# Patient Record
Sex: Female | Born: 1970 | Race: White | Hispanic: No | Marital: Married | Smoking: Never smoker
Health system: Southern US, Community
[De-identification: ages and names within clinical notes are randomized; demographics above are authoritative.]

## PROBLEM LIST (undated history)

## (undated) DIAGNOSIS — F909 Attention-deficit hyperactivity disorder, unspecified type: Secondary | ICD-10-CM

## (undated) DIAGNOSIS — F419 Anxiety disorder, unspecified: Secondary | ICD-10-CM

## (undated) DIAGNOSIS — F32A Depression, unspecified: Secondary | ICD-10-CM

## (undated) HISTORY — DX: Depression, unspecified: F32.A

## (undated) HISTORY — PX: ANTERIOR CRUCIATE LIGAMENT REPAIR: SHX115

## (undated) HISTORY — DX: Attention-deficit hyperactivity disorder, unspecified type: F90.9

## (undated) HISTORY — DX: Anxiety disorder, unspecified: F41.9

---

## 1998-03-08 ENCOUNTER — Other Ambulatory Visit: Admission: RE | Admit: 1998-03-08 | Discharge: 1998-03-08 | Payer: Self-pay | Admitting: Obstetrics & Gynecology

## 1999-02-21 ENCOUNTER — Other Ambulatory Visit: Admission: RE | Admit: 1999-02-21 | Discharge: 1999-02-21 | Payer: Self-pay | Admitting: Obstetrics & Gynecology

## 2000-03-28 ENCOUNTER — Other Ambulatory Visit: Admission: RE | Admit: 2000-03-28 | Discharge: 2000-03-28 | Payer: Self-pay | Admitting: Obstetrics & Gynecology

## 2001-04-25 ENCOUNTER — Other Ambulatory Visit: Admission: RE | Admit: 2001-04-25 | Discharge: 2001-04-25 | Payer: Self-pay | Admitting: Obstetrics & Gynecology

## 2002-06-10 ENCOUNTER — Other Ambulatory Visit: Admission: RE | Admit: 2002-06-10 | Discharge: 2002-06-10 | Payer: Self-pay | Admitting: Obstetrics & Gynecology

## 2003-06-16 ENCOUNTER — Other Ambulatory Visit: Admission: RE | Admit: 2003-06-16 | Discharge: 2003-06-16 | Payer: Self-pay | Admitting: Obstetrics & Gynecology

## 2004-03-10 ENCOUNTER — Ambulatory Visit (HOSPITAL_COMMUNITY): Admission: RE | Admit: 2004-03-10 | Discharge: 2004-03-10 | Payer: Self-pay | Admitting: Family Medicine

## 2004-08-23 ENCOUNTER — Other Ambulatory Visit: Admission: RE | Admit: 2004-08-23 | Discharge: 2004-08-23 | Payer: Self-pay | Admitting: Obstetrics & Gynecology

## 2005-01-16 ENCOUNTER — Other Ambulatory Visit: Admission: RE | Admit: 2005-01-16 | Discharge: 2005-01-16 | Payer: Self-pay | Admitting: Obstetrics & Gynecology

## 2005-08-23 ENCOUNTER — Other Ambulatory Visit: Admission: RE | Admit: 2005-08-23 | Discharge: 2005-08-23 | Payer: Self-pay | Admitting: Obstetrics & Gynecology

## 2006-01-17 IMAGING — CR DG LUMBAR SPINE COMPLETE 4+V
5 series · 5 of 5 positions shown · non-contrast
Comparison: none

CLINICAL DATA: Low back pain for the past week.  No known injury. 
COMPLETE LUMBAR SPINE ? 03/10/04
Five views demonstrate five non-rib bearing lumbar vertebrae and mild thoracolumbar scoliosis.  Mild anterior and lateral spur formation at the L2-3 and L3-4 levels.  L3 limbus vertebra with fragmentation.  Small calcific density at the lateral aspect of the L3-4 disc space on the left.  No pars defect or subluxations.  
IMPRESSION 
Mild scoliosis and mild degenerative changes, as described above.

[view not recorded (1 of 5)]
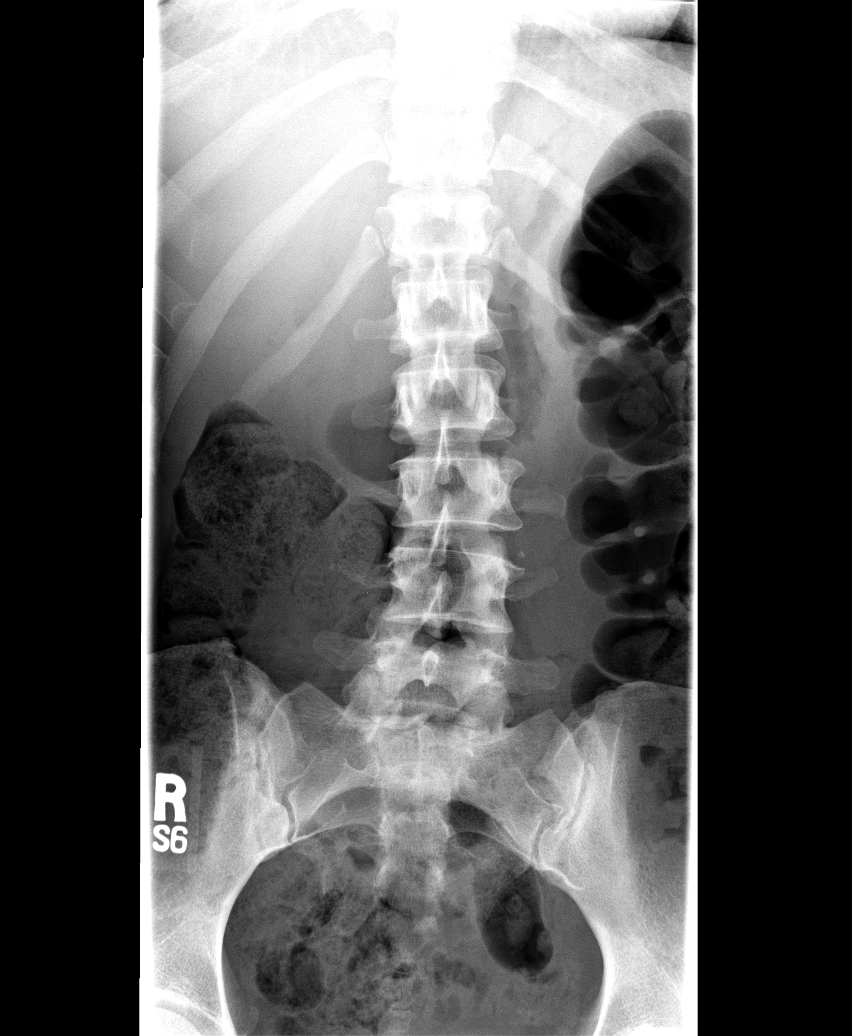

[view not recorded (2 of 5)]
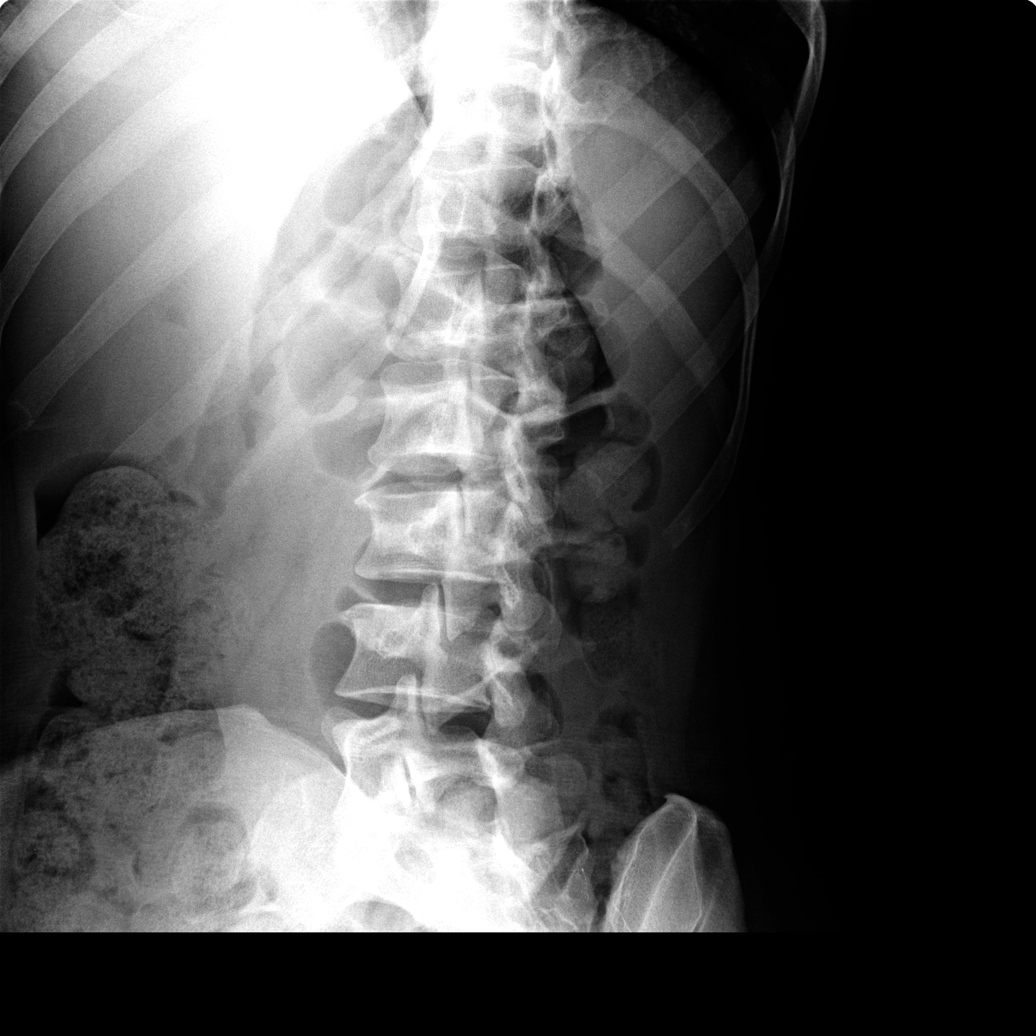

[view not recorded (3 of 5)]
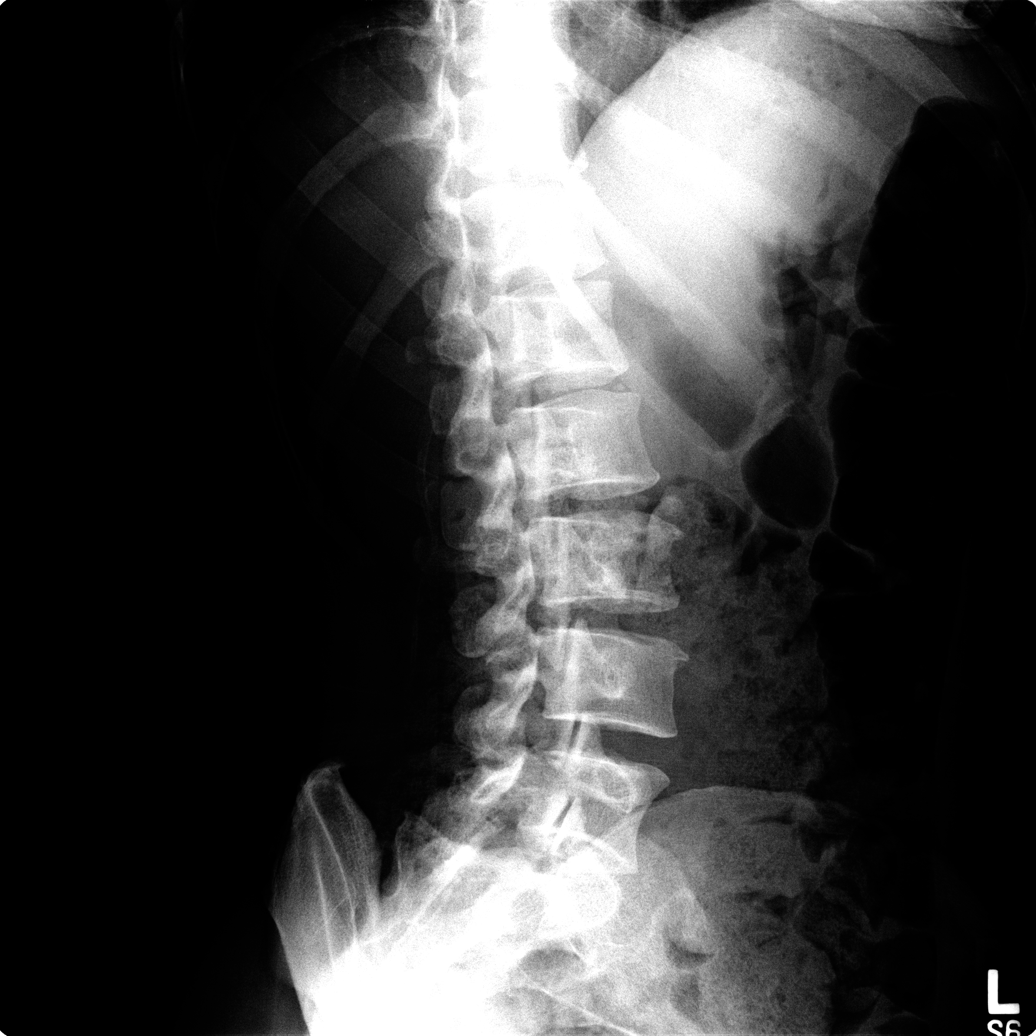

[view not recorded (4 of 5)]
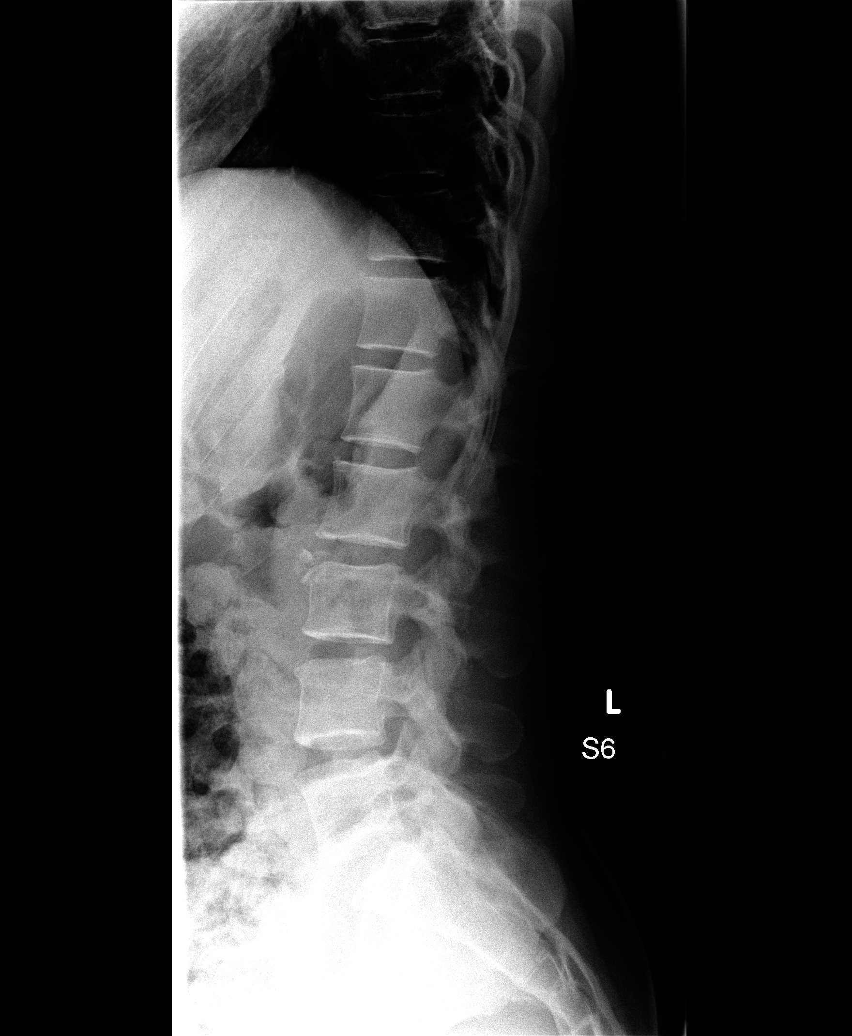

[view not recorded (5 of 5)]
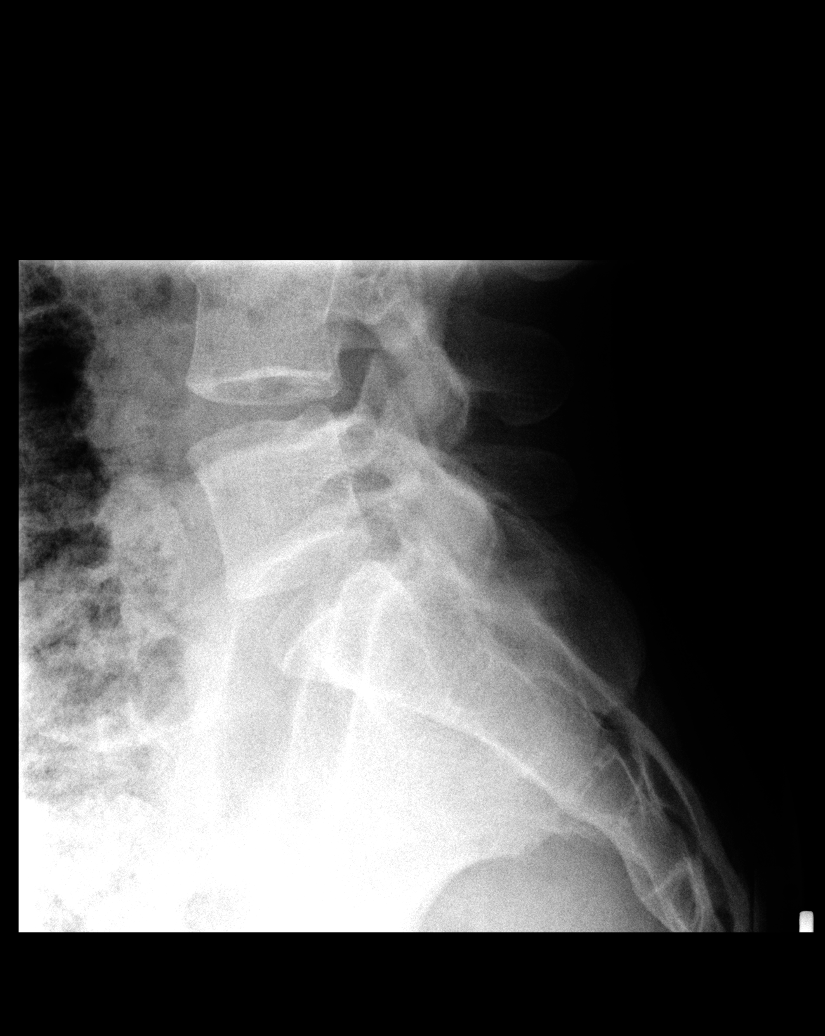

[5 of 5 positions shown; findings below may reference images not displayed]

## 2011-06-27 ENCOUNTER — Other Ambulatory Visit: Payer: Self-pay | Admitting: Obstetrics & Gynecology

## 2011-06-27 DIAGNOSIS — R928 Other abnormal and inconclusive findings on diagnostic imaging of breast: Secondary | ICD-10-CM

## 2011-07-11 ENCOUNTER — Ambulatory Visit
Admission: RE | Admit: 2011-07-11 | Discharge: 2011-07-11 | Disposition: A | Discharge status: Home / Self Care | Payer: BC Managed Care – PPO | Source: Ambulatory Visit | Attending: Obstetrics & Gynecology | Admitting: Obstetrics & Gynecology

## 2011-07-11 DIAGNOSIS — R928 Other abnormal and inconclusive findings on diagnostic imaging of breast: Secondary | ICD-10-CM

## 2014-02-19 ENCOUNTER — Other Ambulatory Visit: Payer: Self-pay | Admitting: Obstetrics & Gynecology

## 2014-02-19 DIAGNOSIS — R928 Other abnormal and inconclusive findings on diagnostic imaging of breast: Secondary | ICD-10-CM

## 2014-03-02 ENCOUNTER — Ambulatory Visit
Admission: RE | Admit: 2014-03-02 | Discharge: 2014-03-02 | Disposition: A | Discharge status: Home / Self Care | Payer: BC Managed Care – PPO | Source: Ambulatory Visit | Attending: Obstetrics & Gynecology | Admitting: Obstetrics & Gynecology

## 2014-03-02 DIAGNOSIS — R928 Other abnormal and inconclusive findings on diagnostic imaging of breast: Secondary | ICD-10-CM

## 2016-03-21 ENCOUNTER — Other Ambulatory Visit: Payer: Self-pay | Admitting: Obstetrics & Gynecology

## 2016-03-21 DIAGNOSIS — R921 Mammographic calcification found on diagnostic imaging of breast: Secondary | ICD-10-CM

## 2016-03-21 DIAGNOSIS — N63 Unspecified lump in unspecified breast: Secondary | ICD-10-CM

## 2016-03-27 ENCOUNTER — Ambulatory Visit
Admission: RE | Admit: 2016-03-27 | Discharge: 2016-03-27 | Disposition: A | Discharge status: Home / Self Care | Payer: BLUE CROSS/BLUE SHIELD | Source: Ambulatory Visit | Attending: Obstetrics & Gynecology | Admitting: Obstetrics & Gynecology

## 2016-03-27 DIAGNOSIS — R921 Mammographic calcification found on diagnostic imaging of breast: Secondary | ICD-10-CM

## 2016-04-04 DIAGNOSIS — E785 Hyperlipidemia, unspecified: Secondary | ICD-10-CM

## 2016-04-04 DIAGNOSIS — F4001 Agoraphobia with panic disorder: Secondary | ICD-10-CM | POA: Insufficient documentation

## 2016-04-04 HISTORY — DX: Hyperlipidemia, unspecified: E78.5

## 2017-04-02 ENCOUNTER — Other Ambulatory Visit: Payer: Self-pay | Admitting: Obstetrics & Gynecology

## 2017-04-02 DIAGNOSIS — N6001 Solitary cyst of right breast: Secondary | ICD-10-CM

## 2017-04-05 ENCOUNTER — Ambulatory Visit
Admission: RE | Admit: 2017-04-05 | Discharge: 2017-04-05 | Disposition: A | Discharge status: Home / Self Care | Payer: BLUE CROSS/BLUE SHIELD | Source: Ambulatory Visit | Attending: Obstetrics & Gynecology | Admitting: Obstetrics & Gynecology

## 2017-04-05 DIAGNOSIS — N6001 Solitary cyst of right breast: Secondary | ICD-10-CM

## 2017-04-09 DIAGNOSIS — E7800 Pure hypercholesterolemia, unspecified: Secondary | ICD-10-CM | POA: Insufficient documentation

## 2018-02-20 DIAGNOSIS — F419 Anxiety disorder, unspecified: Secondary | ICD-10-CM | POA: Insufficient documentation

## 2018-02-20 DIAGNOSIS — F988 Other specified behavioral and emotional disorders with onset usually occurring in childhood and adolescence: Secondary | ICD-10-CM | POA: Insufficient documentation

## 2018-02-20 DIAGNOSIS — G43009 Migraine without aura, not intractable, without status migrainosus: Secondary | ICD-10-CM | POA: Insufficient documentation

## 2022-12-14 ENCOUNTER — Encounter: Payer: Self-pay | Admitting: Physician Assistant

## 2022-12-14 ENCOUNTER — Ambulatory Visit (INDEPENDENT_AMBULATORY_CARE_PROVIDER_SITE_OTHER): Payer: 59 | Admitting: Physician Assistant

## 2022-12-14 VITALS — BP 90/62 | HR 70 | Temp 98.0°F | Ht 65.35 in | Wt 125.8 lb

## 2022-12-14 DIAGNOSIS — F32A Depression, unspecified: Secondary | ICD-10-CM

## 2022-12-14 DIAGNOSIS — Z0000 Encounter for general adult medical examination without abnormal findings: Secondary | ICD-10-CM | POA: Diagnosis not present

## 2022-12-14 DIAGNOSIS — Z23 Encounter for immunization: Secondary | ICD-10-CM | POA: Diagnosis not present

## 2022-12-14 DIAGNOSIS — F419 Anxiety disorder, unspecified: Secondary | ICD-10-CM | POA: Diagnosis not present

## 2022-12-14 HISTORY — DX: Encounter for general adult medical examination without abnormal findings: Z00.00

## 2022-12-14 LAB — CBC WITH DIFFERENTIAL/PLATELET
Basophils Absolute: 0 10*3/uL (ref 0.0–0.1)
Basophils Relative: 0.8 % (ref 0.0–3.0)
Eosinophils Absolute: 0.1 10*3/uL (ref 0.0–0.7)
Eosinophils Relative: 1.5 % (ref 0.0–5.0)
HCT: 40.6 % (ref 36.0–46.0)
Hemoglobin: 13.7 g/dL (ref 12.0–15.0)
Lymphocytes Relative: 39.2 % (ref 12.0–46.0)
Lymphs Abs: 2.2 10*3/uL (ref 0.7–4.0)
MCHC: 33.6 g/dL (ref 30.0–36.0)
MCV: 90.6 fl (ref 78.0–100.0)
Monocytes Absolute: 0.5 10*3/uL (ref 0.1–1.0)
Monocytes Relative: 8.3 % (ref 3.0–12.0)
Neutro Abs: 2.9 10*3/uL (ref 1.4–7.7)
Neutrophils Relative %: 50.2 % (ref 43.0–77.0)
Platelets: 254 10*3/uL (ref 150.0–400.0)
RBC: 4.48 Mil/uL (ref 3.87–5.11)
RDW: 13.4 % (ref 11.5–15.5)
WBC: 5.7 10*3/uL (ref 4.0–10.5)

## 2022-12-14 LAB — COMPREHENSIVE METABOLIC PANEL
ALT: 24 U/L (ref 0–35)
AST: 25 U/L (ref 0–37)
Albumin: 4.4 g/dL (ref 3.5–5.2)
Alkaline Phosphatase: 69 U/L (ref 39–117)
BUN: 17 mg/dL (ref 6–23)
CO2: 30 mEq/L (ref 19–32)
Calcium: 9.5 mg/dL (ref 8.4–10.5)
Chloride: 101 mEq/L (ref 96–112)
Creatinine, Ser: 0.76 mg/dL (ref 0.40–1.20)
GFR: 89.51 mL/min (ref >60.00)
Glucose, Bld: 90 mg/dL (ref 70–99)
Potassium: 4.2 mEq/L (ref 3.5–5.1)
Sodium: 139 mEq/L (ref 135–145)
Total Bilirubin: 0.6 mg/dL (ref 0.2–1.2)
Total Protein: 7 g/dL (ref 6.0–8.3)

## 2022-12-14 LAB — TSH: TSH: 1.88 u[IU]/mL (ref 0.35–5.50)

## 2022-12-14 LAB — VITAMIN D 25 HYDROXY (VIT D DEFICIENCY, FRACTURES): VITD: 29.15 ng/mL — ABNORMAL LOW (ref 30.00–100.00)

## 2022-12-14 LAB — HEMOGLOBIN A1C: Hgb A1c MFr Bld: 6.1 % (ref 4.6–6.5)

## 2022-12-14 LAB — LIPID PANEL
Cholesterol: 229 mg/dL — ABNORMAL HIGH (ref 0–200)
HDL: 52.7 mg/dL (ref >39.00)
LDL Cholesterol: 164 mg/dL — ABNORMAL HIGH (ref 0–99)
NonHDL: 176.37
Total CHOL/HDL Ratio: 4
Triglycerides: 62 mg/dL (ref 0.0–149.0)
VLDL: 12.4 mg/dL (ref 0.0–40.0)

## 2022-12-14 LAB — VITAMIN B12: Vitamin B-12: 511 pg/mL (ref 211–911)

## 2022-12-14 MED ORDER — ESCITALOPRAM OXALATE 20 MG PO TABS: 20.0000 mg | ORAL_TABLET | Freq: Every day | ORAL | 3 refills | Status: AC

## 2022-12-14 NOTE — Progress Notes (Signed)
Subjective:    Patient ID: Sara Barker, female    DOB: January 13, 1971, 52 y.o.   MRN: [id number]  Chief Complaint  Patient presents with   New Patient (Initial Visit)    New pt in office to establish care and requesting to do annual CPE today as well; pt is fasting for labs; pt sometimes get head rush when standing up; pt lived in [place] for sometime and just moving back;      HPI 52 y.o. patient presents today for new patient establishment with me.  Patient was previously established with Sara Barker. Works virtually.   Current Care Team: [facility]  [facility] - ADHD management   Acute concerns: Needs Lexapro refilled  Health maintenance: Lifestyle/ exercise: Used to be a running, trying to get back into routine Nutrition: Doing well Mental health: Stable  Sleep: occ postmenopausal night sweats  Substance use: None  Sexual activity: Monogamous with husband  Immunizations: Tdap updated today  Colonoscopy: Candidate for Cologuard Pap: [facility] Mammogram: [facility] Menstrual: None; LMP about 5 years ago  Skin: Annual dermatology, no new concerns    Past Medical History:  Diagnosis Date   ADHD    Anxiety    Depression    Encounter for annual physical exam 12/14/2022    Past Surgical History:  Procedure Laterality Date   ANTERIOR CRUCIATE LIGAMENT REPAIR Left    1992    Family History  Problem Relation Age of Onset   Hypertension Mother    Alcohol abuse Father    Heart disease Father        first MI at 70   Kidney disease Father    Cancer Paternal Grandfather     Social History   Tobacco Use   Smoking status: Never   Smokeless tobacco: Never  Vaping Use   Vaping Use: Never used  Substance Use Topics   Alcohol use: Not Currently   Drug use: Never     Allergies  Allergen Reactions   Erythromycin Nausea Only    Review of Systems NEGATIVE UNLESS OTHERWISE INDICATED IN HPI      Objective:      BP 90/62 (BP Location: Left Arm)   Pulse 70   Temp 98 F (36.7 C) (Temporal)   Ht 5' 5.35" (1.66 m)   Wt 125 lb 12.8 oz (57.1 kg)   SpO2 98%   BMI 20.71 kg/m   Wt Readings from Last 3 Encounters:  12/14/22 125 lb 12.8 oz (57.1 kg)    BP Readings from Last 3 Encounters:  12/14/22 90/62     Physical Exam Vitals and nursing note reviewed.  Constitutional:      Appearance: Normal appearance. She is normal weight. She is not toxic-appearing.  HENT:     Head: Normocephalic and atraumatic.     Right Ear: Tympanic membrane, ear canal and external ear normal.     Left Ear: Tympanic membrane, ear canal and external ear normal.     Nose: Nose normal.     Mouth/Throat:     Mouth: Mucous membranes are moist.  Eyes:     Extraocular Movements: Extraocular movements intact.     Conjunctiva/sclera: Conjunctivae normal.     Pupils: Pupils are equal, round, and reactive to light.  Cardiovascular:     Rate and Rhythm: Normal rate and regular rhythm.     Pulses: Normal pulses.     Heart sounds: Normal heart sounds.  Pulmonary:  Effort: Pulmonary effort is normal.     Breath sounds: Normal breath sounds.  Abdominal:     General: Abdomen is flat. Bowel sounds are normal.     Palpations: Abdomen is soft.  Musculoskeletal:        General: Normal range of motion.     Cervical back: Normal range of motion and neck supple.  Skin:    General: Skin is warm and dry.  Neurological:     General: No focal deficit present.     Mental Status: She is alert and oriented to person, place, and time.  Psychiatric:        Mood and Affect: Mood normal.        Behavior: Behavior normal.        Thought Content: Thought content normal.        Judgment: Judgment normal.        Assessment & Plan:  Encounter for annual physical exam -     CBC with Differential/Platelet -     Comprehensive metabolic panel -     Hemoglobin A1c -     Lipid panel -     TSH -     Vitamin B12 -     VITAMIN D  25 Hydroxy (Vit-D Deficiency, Fractures)  Anxiety and depression  Need for prophylactic vaccination with combined diphtheria-tetanus-pertussis (DTP) vaccine -     Tdap vaccine greater than or equal to 7yo IM  Other orders -     Escitalopram Oxalate; Take 1 tablet (20 mg total) by mouth daily.  Dispense: 90 tablet; Refill: 3    Age-appropriate screening and counseling performed today. Will check labs and call with results. Preventive measures discussed and printed in AVS for patient.   Tdap updated today Pap and mammo with her GYN Cologuard candidate, will order in two weeks once established at new address   Patient Counseling: [x]   Nutrition: Stressed importance of moderation in sodium/caffeine intake, saturated fat and cholesterol, caloric balance, sufficient intake of fresh fruits, vegetables, and fiber.  [x]   Stressed the importance of regular exercise.   []   Substance Abuse: Discussed cessation/primary prevention of tobacco, alcohol, or other drug use; driving or other dangerous activities under the influence; availability of treatment for abuse.   []   Injury prevention: Discussed safety belts, safety helmets, smoke detector, smoking near bedding or upholstery.   []   Sexuality: Discussed sexually transmitted diseases, partner selection, use of condoms, avoidance of unintended pregnancy  and contraceptive alternatives.   [x]   Dental health: Discussed importance of regular tooth brushing, flossing, and dental visits.  [x]   Health maintenance and immunizations reviewed. Please refer to Health maintenance section.       Return in about 1 year (around 12/14/2023) for physical.     Susann Lawhorne M Rei Contee, PA-C

## 2022-12-14 NOTE — Assessment & Plan Note (Signed)
Stable Lexapro 20 mg for years, refilled today.  Working with [facility] for ADHD management.

## 2022-12-14 NOTE — Patient Instructions (Signed)
Welcome to [facility] at [facility]! It was a pleasure meeting you today.  As discussed, Please schedule a 12 month follow up visit today.  PLEASE NOTE:  If you had any LAB tests please let us know if you have not heard back within a few days. You may see your results on MyChart before we have a chance to review them but we will give you a call once they are reviewed by us. If we ordered any REFERRALS today, please let us know if you have not heard from their office within the next two weeks. Let us know through MyChart if you are needing REFILLS, or have your pharmacy send us the request. You can also use MyChart to communicate with me or any office staff.  Please try these tips to maintain a healthy lifestyle:  Eat most of your calories during the day when you are active. Eliminate processed foods including packaged sweets (pies, cakes, cookies), reduce intake of potatoes, white bread, white pasta, and white rice. Look for whole grain options, oat flour or almond flour.  Each meal should contain half fruits/vegetables, one quarter protein, and one quarter carbs (no bigger than a computer mouse).  Cut down on sweet beverages. This includes juice, soda, and sweet tea. Also watch fruit intake, though this is a healthier sweet option, it still contains natural sugar! Limit to 3 servings daily.  Drink at least 1 glass of water with each meal and aim for at least 8 glasses (64 ounces) per day.  Exercise at least 150 minutes every week to the best of your ability.    Take Care,  Arian Mcquitty, PA-C    

## 2022-12-24 ENCOUNTER — Other Ambulatory Visit: Payer: Self-pay

## 2022-12-24 DIAGNOSIS — Z1211 Encounter for screening for malignant neoplasm of colon: Secondary | ICD-10-CM

## 2023-01-12 LAB — COLOGUARD: COLOGUARD: NEGATIVE

## 2023-01-14 NOTE — Progress Notes (Signed)
Cologuard is negative, please repeat in 3 years.

## 2023-12-18 ENCOUNTER — Encounter: Payer: Self-pay | Admitting: Physician Assistant

## 2023-12-18 ENCOUNTER — Ambulatory Visit (INDEPENDENT_AMBULATORY_CARE_PROVIDER_SITE_OTHER): Payer: BLUE CROSS/BLUE SHIELD | Admitting: Physician Assistant

## 2023-12-18 ENCOUNTER — Other Ambulatory Visit (HOSPITAL_COMMUNITY)
Admission: RE | Admit: 2023-12-18 | Discharge: 2023-12-18 | Disposition: A | Discharge status: Home / Self Care | Source: Ambulatory Visit | Attending: Physician Assistant | Admitting: Physician Assistant

## 2023-12-18 VITALS — BP 118/80 | HR 76 | Temp 97.3°F | Ht 65.35 in | Wt 120.0 lb

## 2023-12-18 DIAGNOSIS — F419 Anxiety disorder, unspecified: Secondary | ICD-10-CM

## 2023-12-18 DIAGNOSIS — M25562 Pain in left knee: Secondary | ICD-10-CM | POA: Diagnosis not present

## 2023-12-18 DIAGNOSIS — Z124 Encounter for screening for malignant neoplasm of cervix: Secondary | ICD-10-CM | POA: Insufficient documentation

## 2023-12-18 DIAGNOSIS — Z131 Encounter for screening for diabetes mellitus: Secondary | ICD-10-CM

## 2023-12-18 DIAGNOSIS — Z113 Encounter for screening for infections with a predominantly sexual mode of transmission: Secondary | ICD-10-CM | POA: Insufficient documentation

## 2023-12-18 DIAGNOSIS — N6001 Solitary cyst of right breast: Secondary | ICD-10-CM

## 2023-12-18 DIAGNOSIS — Z1151 Encounter for screening for human papillomavirus (HPV): Secondary | ICD-10-CM | POA: Diagnosis not present

## 2023-12-18 DIAGNOSIS — F32A Depression, unspecified: Secondary | ICD-10-CM

## 2023-12-18 DIAGNOSIS — Z13220 Encounter for screening for lipoid disorders: Secondary | ICD-10-CM

## 2023-12-18 DIAGNOSIS — Z0000 Encounter for general adult medical examination without abnormal findings: Secondary | ICD-10-CM

## 2023-12-18 LAB — COMPREHENSIVE METABOLIC PANEL WITH GFR
ALT: 16 U/L (ref 0–35)
AST: 19 U/L (ref 0–37)
Albumin: 4.5 g/dL (ref 3.5–5.2)
Alkaline Phosphatase: 65 U/L (ref 39–117)
BUN: 10 mg/dL (ref 6–23)
CO2: 29 meq/L (ref 19–32)
Calcium: 9.3 mg/dL (ref 8.4–10.5)
Chloride: 100 meq/L (ref 96–112)
Creatinine, Ser: 0.74 mg/dL (ref 0.40–1.20)
GFR: 91.76 mL/min (ref >60.00)
Glucose, Bld: 100 mg/dL — ABNORMAL HIGH (ref 70–99)
Potassium: 4 meq/L (ref 3.5–5.1)
Sodium: 135 meq/L (ref 135–145)
Total Bilirubin: 0.8 mg/dL (ref 0.2–1.2)
Total Protein: 6.7 g/dL (ref 6.0–8.3)

## 2023-12-18 LAB — LIPID PANEL
Cholesterol: 221 mg/dL — ABNORMAL HIGH (ref 0–200)
HDL: 54.7 mg/dL (ref >39.00)
LDL Cholesterol: 156 mg/dL — ABNORMAL HIGH (ref 0–99)
NonHDL: 166.07
Total CHOL/HDL Ratio: 4
Triglycerides: 49 mg/dL (ref 0.0–149.0)
VLDL: 9.8 mg/dL (ref 0.0–40.0)

## 2023-12-18 LAB — CBC WITH DIFFERENTIAL/PLATELET
Basophils Absolute: 0 10*3/uL (ref 0.0–0.1)
Basophils Relative: 0.9 % (ref 0.0–3.0)
Eosinophils Absolute: 0.1 10*3/uL (ref 0.0–0.7)
Eosinophils Relative: 1.9 % (ref 0.0–5.0)
HCT: 40.8 % (ref 36.0–46.0)
Hemoglobin: 13.2 g/dL (ref 12.0–15.0)
Lymphocytes Relative: 39.4 % (ref 12.0–46.0)
Lymphs Abs: 1.9 10*3/uL (ref 0.7–4.0)
MCHC: 32.3 g/dL (ref 30.0–36.0)
MCV: 91.9 fl (ref 78.0–100.0)
Monocytes Absolute: 0.4 10*3/uL (ref 0.1–1.0)
Monocytes Relative: 9 % (ref 3.0–12.0)
Neutro Abs: 2.3 10*3/uL (ref 1.4–7.7)
Neutrophils Relative %: 48.8 % (ref 43.0–77.0)
Platelets: 269 10*3/uL (ref 150.0–400.0)
RBC: 4.44 Mil/uL (ref 3.87–5.11)
RDW: 13.4 % (ref 11.5–15.5)
WBC: 4.7 10*3/uL (ref 4.0–10.5)

## 2023-12-18 LAB — VITAMIN B12: Vitamin B-12: 953 pg/mL — ABNORMAL HIGH (ref 211–911)

## 2023-12-18 LAB — TSH: TSH: 2.6 u[IU]/mL (ref 0.35–5.50)

## 2023-12-18 LAB — VITAMIN D 25 HYDROXY (VIT D DEFICIENCY, FRACTURES): VITD: 40.47 ng/mL (ref 30.00–100.00)

## 2023-12-18 LAB — HEMOGLOBIN A1C: Hgb A1c MFr Bld: 6.1 % (ref 4.6–6.5)

## 2023-12-18 NOTE — Progress Notes (Signed)
 Patient ID: Sara Barker, female    DOB: 05/26/1971, 53 y.o.   MRN: [id number]   Assessment & Plan:  Annual physical exam  Screening for cervical cancer      Assessment and Plan Assessment & Plan L knee pain Knee synovial fluid leakage with new ? Small mass. Previous surgeries in 1991 and 2015. Discussed potential for drainage or further orthopedic evaluation. - Refer to [facility] for evaluation and possible ultrasound.  Anxiety She is following with psychiatry Anxiety with panic episodes, concerns about hypotension with propranolol. Propranolol prescribed as needed. Buspirone prescribed but not started. Exercise benefits discussed. - Consider propranolol as needed for anxiety. - Consider starting buspirone. - Encourage regular exercise.  Wellness Visit Routine wellness visit. Overdue for mammogram. Pap smear due. Cologuard not due until 2027. No significant breast tissue changes. Discussed exercise and nutrition. - Perform Pap smear. - Schedule mammogram. - Encourage regular exercise. - Encourage balanced nutrition.  Age-appropriate screening and counseling performed today. Will check labs and call with results. Preventive measures discussed and printed in AVS for patient.   Patient Counseling: [x]   Nutrition: Stressed importance of moderation in sodium/caffeine intake, saturated fat and cholesterol, caloric balance, sufficient intake of fresh fruits, vegetables, and fiber.  [x]   Stressed the importance of regular exercise.   []   Substance Abuse: Discussed cessation/primary prevention of tobacco, alcohol, or other drug use; driving or other dangerous activities under the influence; availability of treatment for abuse.   []   Injury prevention: Discussed safety belts, safety helmets, smoke detector, smoking near bedding or upholstery.   []   Sexuality: Discussed sexually transmitted diseases, partner selection, use of condoms, avoidance of unintended  pregnancy  and contraceptive alternatives.   [x]   Dental health: Discussed importance of regular tooth brushing, flossing, and dental visits.  [x]   Health maintenance and immunizations reviewed. Please refer to Health maintenance section.          No follow-ups on file.    Subjective:    Chief Complaint  Patient presents with   Annual Exam    Pt in office for annual CPE and fasting labs;     HPI Discussed the use of AI scribe software for clinical note transcription with the patient, who gave verbal consent to proceed.  History of Present Illness Sara Barker is a 53 year old female who presents for a routine annual physical exam including a breast exam and Pap smear.  She is overdue for a Pap smear, with the last one performed approximately five years ago. She has a history of an abnormal Pap smear that resolved without further issues. She is also overdue for a mammogram and has a history of a cyst in the right breast, previously evaluated by a breast center. Her last mammogram was diagnostic, and she reports no breast changes, nipple discharge, or skin changes.  She experiences occasional ear discomfort, described as a 'drainage feeling' and 'popping feeling,' possibly related to recent travel or elevation changes. She plans to fly again soon. A swollen lymph node has been present for a while without increasing in size, associated with her ear symptoms.  She has a history of knee surgery in 1991 and another in 2015 due to synovial fluid leakage. Recently, she noticed a new swelling in the knee area, described as 'golf ball' size, developing over the past two weeks. She attributes this to a period of inactivity followed by resuming exercise.  She experiences increased anxiety and was recently prescribed  propranolol as needed but has not started it due to concerns about low blood pressure. She also has a prescription for buspirone, which she has not started, considering  exercise for anxiety management.  She is sexually active with no concerns about sexually transmitted diseases. Her social history includes no smoking, vaping, or alcohol use. She is attempting to return to regular exercise and has recently joined a gym to regain muscle mass.     Past Medical History:  Diagnosis Date   ADHD    Anxiety    Depression    Encounter for annual physical exam 12/14/2022    Past Surgical History:  Procedure Laterality Date   ANTERIOR CRUCIATE LIGAMENT REPAIR Left    1992    Family History  Problem Relation Age of Onset   Hypertension Mother    Alcohol abuse Father    Heart disease Father        first MI at 34   Kidney disease Father    Cancer Paternal Grandfather     Social History   Tobacco Use   Smoking status: Never   Smokeless tobacco: Never  Vaping Use   Vaping status: Never Used  Substance Use Topics   Alcohol use: Not Currently   Drug use: Never     Allergies  Allergen Reactions   Erythromycin Nausea Only   Erythromycin Base Dermatitis and Other (See Comments)    unknown    Review of Systems NEGATIVE UNLESS OTHERWISE INDICATED IN HPI      Objective:     Ht 5\' 5"  (1.651 m)   BMI 20.93 kg/m   Wt Readings from Last 3 Encounters:  12/14/22 125 lb 12.8 oz (57.1 kg)    BP Readings from Last 3 Encounters:  12/14/22 90/62     Physical Exam Vitals and nursing note reviewed. Exam conducted with a chaperone present.  Constitutional:      Appearance: Normal appearance. She is normal weight. She is not toxic-appearing.  HENT:     Head: Normocephalic and atraumatic.     Right Ear: Tympanic membrane, ear canal and external ear normal.     Left Ear: Tympanic membrane, ear canal and external ear normal.     Nose: Nose normal.     Mouth/Throat:     Mouth: Mucous membranes are moist.  Eyes:     Extraocular Movements: Extraocular movements intact.     Conjunctiva/sclera: Conjunctivae normal.     Pupils: Pupils are equal,  round, and reactive to light.  Neck:     Thyroid: No thyroid mass, thyromegaly or thyroid tenderness.  Cardiovascular:     Rate and Rhythm: Normal rate and regular rhythm.     Pulses: Normal pulses.     Heart sounds: Normal heart sounds.  Pulmonary:     Effort: Pulmonary effort is normal.     Breath sounds: Normal breath sounds.  Chest:     Chest wall: No mass.  Breasts:    Right: Mass (right lower outer quadrant mobile soft oval well-circumscribed edges - pt reports cyst) present. No swelling, bleeding, inverted nipple, nipple discharge, skin change or tenderness.     Left: Normal. No swelling, bleeding, inverted nipple, mass, nipple discharge, skin change or tenderness.  Abdominal:     General: Abdomen is flat. Bowel sounds are normal.     Palpations: Abdomen is soft.  Genitourinary:    General: Normal vulva.     Labia:        Right: No rash, tenderness or  lesion.        Left: No rash, tenderness or lesion.      Vagina: Normal.     Cervix: Normal.     Uterus: Normal.      Adnexa: Right adnexa normal and left adnexa normal.  Musculoskeletal:        General: Swelling (area of edema L lateral knee near her prior surgical scar) present. No tenderness. Normal range of motion.     Cervical back: Normal range of motion and neck supple.     Right lower leg: No edema.     Left lower leg: No edema.  Lymphadenopathy:     Cervical: No cervical adenopathy.     Upper Body:     Right upper body: No supraclavicular, axillary or pectoral adenopathy.     Left upper body: No supraclavicular, axillary or pectoral adenopathy.  Skin:    General: Skin is warm and dry.     Findings: No lesion.  Neurological:     General: No focal deficit present.     Mental Status: She is alert and oriented to person, place, and time.  Psychiatric:        Mood and Affect: Mood normal.        Behavior: Behavior normal.        Thought Content: Thought content normal.        Judgment: Judgment normal.              Kamarii Carton M Everhett Bozard, PA-C

## 2023-12-18 NOTE — Patient Instructions (Addendum)
 Please call the [facility] to schedule your mammogram. [phone]  Call [facility] about your knee pain  Address: [address] [place], [place] [address] Phone: [phone]  Keep up good work!

## 2023-12-20 LAB — CYTOLOGY - PAP
Chlamydia: NEGATIVE
Comment: NEGATIVE
Comment: NEGATIVE
Comment: NEGATIVE
Comment: NORMAL
Diagnosis: UNDETERMINED — AB
High risk HPV: NEGATIVE
Neisseria Gonorrhea: NEGATIVE
Trichomonas: NEGATIVE

## 2023-12-21 ENCOUNTER — Other Ambulatory Visit: Payer: Self-pay | Admitting: Physician Assistant

## 2023-12-23 NOTE — Telephone Encounter (Signed)
 Please see pt request and advise or send refill for patient

## 2023-12-23 NOTE — Telephone Encounter (Signed)
 Please see pt msg and advise

## 2023-12-24 ENCOUNTER — Other Ambulatory Visit: Payer: Self-pay

## 2023-12-25 ENCOUNTER — Ambulatory Visit: Payer: Self-pay | Admitting: Physician Assistant

## 2023-12-26 NOTE — Progress Notes (Unsigned)
 I, Leone Ralphs am a scribe for Dr. Garlan Juniper, MD.  Sara Barker is a 53 y.o. female who presents to [facility] today for L knee pain ongoing for about 2-wks. Hx of knee surgeries in 1991 and 2015. Pt locates pain to lateral side of the knee had a bump on it but now it has gone down. Knee was tighter while it was there but nothing else. Knee swells after exercise. Usually use use to calm it down. Medication for pain is naproxen.   Pertinent review of systems: No fevers or chills  Relevant historical information: Otherwise healthy History of left knee surgery including a parameniscal cyst removal lateral knee.  Exam:  BP 98/60   Pulse 70   Ht 5' 5.35" (1.66 m)   SpO2 96%   BMI 19.76 kg/m  General: Well Developed, well nourished, and in no acute distress.   MSK: Left knee mature scar lateral knee otherwise normal-appearing Normal motion. Intact strength. Stable ligamentous exam.    Lab and Radiology Results  [facility] MSK Ultrasound of: Left knee Quad tendon is intact.  Mild joint effusion is present superior patellar space. Patellar tendon is normal-appearing Medial meniscus intact normal-appearing. Lateral knee diminutive lateral meniscus with no parameniscal cyst. Patient does have a little bit of swelling in the superior lateral knee near the scar Posterior knee no Baker's cyst Impression: Mild joint effusion   X-ray images left knee obtained today personally and independently interpreted. Mild degenerative change use.  Hardware from prior ACL reconstruction are present medial tibia Await formal radiology review  Assessment and Plan: 53 y.o. female with left knee pain and swelling.  She does have some posttraumatic degenerative changes and some soft tissue changes from her surgery that are generating a joint effusion.  This will sometimes be noticeable and uncomfortable.  Plan to treat with compression sleeve and  Voltaren gel.  She wants to get back into exercising and especially doing some weight lifting.  This should be helpful especially to increase quad strength and improve his knee pain.  Recommend avoiding deep squats and deep lunges.  Check back as needed.  Happy to do a steroid injection whenever it makes sense.  We also talked a bit about osteoporosis prevention.  She is a petite Caucasian woman and is at relative risk for osteoporosis.  Weightbearing exercise and resistance training are a great idea for her and I encouraged her to get started on those.  Vitamin D  level was checked by her PCP at her last wellness visit and was okay.  Do recommend that she get a bone density test at some point in the near future likely with her next wellness visit.   PDMP not reviewed this encounter. Orders Placed This Encounter  Procedures   US  LIMITED JOINT SPACE STRUCTURES LOW LEFT(NO LINKED CHARGES)    Reason for Exam (SYMPTOM  OR DIAGNOSIS REQUIRED):   knee pain    Preferred imaging location?:   [facility] Knee AP/LAT W/[place] Left    Standing Status:   Future    Expiration Date:   01/27/2024    Reason for Exam (SYMPTOM  OR DIAGNOSIS REQUIRED):   left knee pain    Preferred imaging location?:   [facility]    Is patient pregnant?:   No   No orders of the defined types were placed in this encounter.    Discussed warning signs or symptoms. Please see discharge  instructions. Patient expresses understanding.   The above documentation has been reviewed and is accurate and complete Garlan Juniper, M.D.

## 2023-12-27 ENCOUNTER — Other Ambulatory Visit: Payer: Self-pay

## 2023-12-27 ENCOUNTER — Ambulatory Visit (INDEPENDENT_AMBULATORY_CARE_PROVIDER_SITE_OTHER)

## 2023-12-27 ENCOUNTER — Ambulatory Visit: Admitting: Family Medicine

## 2023-12-27 ENCOUNTER — Encounter: Payer: Self-pay | Admitting: Family Medicine

## 2023-12-27 VITALS — BP 98/60 | HR 70 | Ht 65.35 in

## 2023-12-27 DIAGNOSIS — M25562 Pain in left knee: Secondary | ICD-10-CM

## 2023-12-27 DIAGNOSIS — G8929 Other chronic pain: Secondary | ICD-10-CM | POA: Diagnosis not present

## 2023-12-27 NOTE — Patient Instructions (Addendum)
Thank you for coming in today.   Please get an Xray today before you leave   Check back as needed

## 2023-12-29 ENCOUNTER — Ambulatory Visit: Payer: Self-pay | Admitting: Family Medicine

## 2023-12-29 NOTE — Progress Notes (Signed)
 Left knee x-ray shows an old ACL reconstruction and some arthritis underneath the kneecap.

## 2024-04-01 ENCOUNTER — Other Ambulatory Visit: Payer: Self-pay | Admitting: Obstetrics and Gynecology

## 2024-04-01 DIAGNOSIS — R928 Other abnormal and inconclusive findings on diagnostic imaging of breast: Secondary | ICD-10-CM

## 2024-04-07 ENCOUNTER — Ambulatory Visit
Admission: RE | Admit: 2024-04-07 | Discharge: 2024-04-07 | Disposition: A | Discharge status: Home / Self Care | Source: Ambulatory Visit | Attending: Obstetrics and Gynecology

## 2024-04-07 ENCOUNTER — Ambulatory Visit
Admission: RE | Admit: 2024-04-07 | Discharge: 2024-04-07 | Disposition: A | Discharge status: Home / Self Care | Source: Ambulatory Visit | Attending: Obstetrics and Gynecology | Admitting: Obstetrics and Gynecology

## 2024-04-07 DIAGNOSIS — R928 Other abnormal and inconclusive findings on diagnostic imaging of breast: Secondary | ICD-10-CM

## 2024-04-08 ENCOUNTER — Other Ambulatory Visit: Payer: Self-pay | Admitting: Obstetrics and Gynecology

## 2024-04-08 DIAGNOSIS — R921 Mammographic calcification found on diagnostic imaging of breast: Secondary | ICD-10-CM

## 2024-12-18 ENCOUNTER — Encounter: Admitting: Physician Assistant
